# Patient Record
Sex: Female | Born: 1955 | Hispanic: No | Marital: Married | Smoking: Never smoker
Health system: Southern US, Community
[De-identification: ages and names within clinical notes are randomized; demographics above are authoritative.]

---

## 2003-04-18 ENCOUNTER — Encounter: Admission: RE | Admit: 2003-04-18 | Discharge: 2003-04-18 | Payer: Self-pay | Admitting: Family Medicine

## 2003-04-18 ENCOUNTER — Encounter: Payer: Self-pay | Admitting: Family Medicine

## 2004-01-24 ENCOUNTER — Other Ambulatory Visit: Admission: RE | Admit: 2004-01-24 | Discharge: 2004-01-24 | Payer: Self-pay | Admitting: Family Medicine

## 2008-05-18 ENCOUNTER — Other Ambulatory Visit: Admission: RE | Admit: 2008-05-18 | Discharge: 2008-05-18 | Payer: Self-pay | Admitting: Family Medicine

## 2011-05-09 ENCOUNTER — Other Ambulatory Visit: Payer: Self-pay | Admitting: Family Medicine

## 2011-05-09 ENCOUNTER — Other Ambulatory Visit (HOSPITAL_COMMUNITY)
Admission: RE | Admit: 2011-05-09 | Discharge: 2011-05-09 | Disposition: A | Discharge status: Home / Self Care | Payer: BC Managed Care – PPO | Source: Ambulatory Visit | Attending: Family Medicine | Admitting: Family Medicine

## 2011-05-09 DIAGNOSIS — Z0000 Encounter for general adult medical examination without abnormal findings: Secondary | ICD-10-CM | POA: Insufficient documentation

## 2011-05-13 ENCOUNTER — Other Ambulatory Visit: Payer: Self-pay | Admitting: Family Medicine

## 2011-05-13 DIAGNOSIS — Z1231 Encounter for screening mammogram for malignant neoplasm of breast: Secondary | ICD-10-CM

## 2011-05-15 ENCOUNTER — Ambulatory Visit
Admission: RE | Admit: 2011-05-15 | Discharge: 2011-05-15 | Disposition: A | Discharge status: Home / Self Care | Payer: BC Managed Care – PPO | Source: Ambulatory Visit | Attending: Family Medicine | Admitting: Family Medicine

## 2011-05-15 DIAGNOSIS — Z1231 Encounter for screening mammogram for malignant neoplasm of breast: Secondary | ICD-10-CM

## 2012-07-02 ENCOUNTER — Other Ambulatory Visit: Payer: Self-pay | Admitting: Dermatology

## 2014-03-02 ENCOUNTER — Other Ambulatory Visit: Payer: Self-pay | Admitting: Family Medicine

## 2014-03-02 ENCOUNTER — Other Ambulatory Visit (HOSPITAL_COMMUNITY)
Admission: RE | Admit: 2014-03-02 | Discharge: 2014-03-02 | Disposition: A | Discharge status: Home / Self Care | Payer: BC Managed Care – PPO | Source: Ambulatory Visit | Attending: Family Medicine | Admitting: Family Medicine

## 2014-03-02 DIAGNOSIS — Z0000 Encounter for general adult medical examination without abnormal findings: Secondary | ICD-10-CM | POA: Insufficient documentation

## 2014-03-07 LAB — CYTOLOGY - PAP

## 2014-03-17 ENCOUNTER — Other Ambulatory Visit: Payer: Self-pay | Admitting: Family Medicine

## 2014-03-17 ENCOUNTER — Ambulatory Visit
Admission: RE | Admit: 2014-03-17 | Discharge: 2014-03-17 | Disposition: A | Discharge status: Home / Self Care | Payer: BC Managed Care – PPO | Source: Ambulatory Visit | Attending: Family Medicine | Admitting: Family Medicine

## 2014-03-17 DIAGNOSIS — R059 Cough, unspecified: Secondary | ICD-10-CM

## 2014-03-17 DIAGNOSIS — R05 Cough: Secondary | ICD-10-CM

## 2014-04-14 ENCOUNTER — Other Ambulatory Visit: Payer: Self-pay

## 2014-04-14 DIAGNOSIS — Z1231 Encounter for screening mammogram for malignant neoplasm of breast: Secondary | ICD-10-CM

## 2014-04-21 ENCOUNTER — Ambulatory Visit
Admission: RE | Admit: 2014-04-21 | Discharge: 2014-04-21 | Disposition: A | Discharge status: Home / Self Care | Payer: BC Managed Care – PPO | Source: Ambulatory Visit

## 2014-04-21 DIAGNOSIS — Z1231 Encounter for screening mammogram for malignant neoplasm of breast: Secondary | ICD-10-CM

## 2014-09-13 ENCOUNTER — Encounter (HOSPITAL_COMMUNITY): Payer: Self-pay | Admitting: *Deleted

## 2014-09-13 ENCOUNTER — Emergency Department (HOSPITAL_COMMUNITY)
Admission: EM | Admit: 2014-09-13 | Discharge: 2014-09-13 | Disposition: A | Discharge status: Home / Self Care | Payer: BC Managed Care – PPO | Attending: Emergency Medicine | Admitting: Emergency Medicine

## 2014-09-13 ENCOUNTER — Emergency Department (HOSPITAL_COMMUNITY): Payer: BC Managed Care – PPO

## 2014-09-13 DIAGNOSIS — Z880 Allergy status to penicillin: Secondary | ICD-10-CM | POA: Insufficient documentation

## 2014-09-13 DIAGNOSIS — R002 Palpitations: Secondary | ICD-10-CM | POA: Diagnosis not present

## 2014-09-13 DIAGNOSIS — R42 Dizziness and giddiness: Secondary | ICD-10-CM | POA: Insufficient documentation

## 2014-09-13 DIAGNOSIS — R531 Weakness: Secondary | ICD-10-CM | POA: Diagnosis present

## 2014-09-13 LAB — CBC
HCT: 40.5 % (ref 36.0–46.0)
Hemoglobin: 13 g/dL (ref 12.0–15.0)
MCH: 26.3 pg (ref 26.0–34.0)
MCHC: 32.1 g/dL (ref 30.0–36.0)
MCV: 81.8 fL (ref 78.0–100.0)
Platelets: 196 10*3/uL (ref 150–400)
RBC: 4.95 MIL/uL (ref 3.87–5.11)
RDW: 13.6 % (ref 11.5–15.5)
WBC: 6.5 10*3/uL (ref 4.0–10.5)

## 2014-09-13 LAB — DIFFERENTIAL
Basophils Absolute: 0 10*3/uL (ref 0.0–0.1)
Basophils Relative: 1 % (ref 0–1)
Eosinophils Absolute: 0.1 10*3/uL (ref 0.0–0.7)
Eosinophils Relative: 1 % (ref 0–5)
Lymphocytes Relative: 32 % (ref 12–46)
Lymphs Abs: 2.1 10*3/uL (ref 0.7–4.0)
Monocytes Absolute: 0.6 10*3/uL (ref 0.1–1.0)
Monocytes Relative: 10 % (ref 3–12)
Neutro Abs: 3.7 10*3/uL (ref 1.7–7.7)
Neutrophils Relative %: 56 % (ref 43–77)

## 2014-09-13 LAB — COMPREHENSIVE METABOLIC PANEL
ALT: 14 U/L (ref 0–35)
AST: 20 U/L (ref 0–37)
Albumin: 4.1 g/dL (ref 3.5–5.2)
Alkaline Phosphatase: 59 U/L (ref 39–117)
Anion gap: 11 (ref 5–15)
BUN: 12 mg/dL (ref 6–23)
CO2: 25 mmol/L (ref 19–32)
Calcium: 9.2 mg/dL (ref 8.4–10.5)
Chloride: 104 mEq/L (ref 96–112)
Creatinine, Ser: 0.8 mg/dL (ref 0.50–1.10)
GFR calc Af Amer: >90 mL/min (ref >90)
GFR calc non Af Amer: 81 mL/min — ABNORMAL LOW (ref >90)
Glucose, Bld: 115 mg/dL — ABNORMAL HIGH (ref 70–99)
Potassium: 3.6 mmol/L (ref 3.5–5.1)
Sodium: 140 mmol/L (ref 135–145)
Total Bilirubin: 0.7 mg/dL (ref 0.3–1.2)
Total Protein: 6.9 g/dL (ref 6.0–8.3)

## 2014-09-13 LAB — APTT: aPTT: 26 seconds (ref 24–37)

## 2014-09-13 LAB — I-STAT TROPONIN, ED: Troponin i, poc: 0 ng/mL (ref 0.00–0.08)

## 2014-09-13 LAB — CBG MONITORING, ED: Glucose-Capillary: 113 mg/dL — ABNORMAL HIGH (ref 70–99)

## 2014-09-13 LAB — PROTIME-INR
INR: 1 (ref 0.00–1.49)
Prothrombin Time: 13.3 seconds (ref 11.6–15.2)

## 2014-09-13 NOTE — ED Notes (Signed)
Pt states not feeling well today and reports lightheadedness.  Pt states that her heart was racing. And then got better.  Reported she had problems with tongue movement.  No deficits.  Pt took someone else's nitro.  Spoke with Ebbie Ridgehris Lawyer and thinks not code stroke start stroke protocol.

## 2014-09-13 NOTE — ED Provider Notes (Signed)
CSN: [id number]     Arrival date & time 09/13/14  1355 History   First MD Initiated Contact with Patient 09/13/14 1604     Chief Complaint  Patient presents with  . Weakness     (Consider location/radiation/quality/duration/timing/severity/associated sxs/prior Treatment) HPI   58yF with palpitations, lightheadedness and dysarthria? Onset around 1100 today. Felt like heart was racing. Lightheaded. Mild blurred vision. Tongue felt full/thick and she felt like she couldn't control it and talk the way she wanted to. Denies any pain anywhere. No SOB. Symptoms lasted for around two hours and improved right as came to ED. She did take one of her husband's nitroglycerin during symptoms without much noticeable change. Currently no complaints. Denies any numbness, tingling or focal neuro problems aside from tongue. No diaphoresis. No nausea. Reports being under significant stress related to her job. No significant PMHx. Not on any meds. Denies drug use. Non smoker.   History reviewed. No pertinent past medical history. History reviewed. No pertinent past surgical history. No family history on file. History  Substance Use Topics  . Smoking status: Never Smoker   . Smokeless tobacco: Not on file  . Alcohol Use: No   OB History    No data available     Review of Systems  All systems reviewed and negative, other than as noted in HPI.   Allergies  Penicillins  Home Medications   Prior to Admission medications   Not on File   BP 125/67 mmHg  Pulse 75  Temp(Src) 98.2 F (36.8 C) (Oral)  Resp 15  Ht 5\' 4"  (1.626 m)  Wt 138 lb (62.596 kg)  BMI 23.68 kg/m2  SpO2 100% Physical Exam  Constitutional: She is oriented to person, place, and time. She appears well-developed and well-nourished. No distress.  Sitting in bed. NAD.  HENT:  Head: Normocephalic and atraumatic.  Mouth/Throat: Oropharynx is clear and moist.  Tongue normal in appearance  Eyes: Conjunctivae and EOM are normal.  Pupils are equal, round, and reactive to light. Right eye exhibits no discharge. Left eye exhibits no discharge.  Neck: Neck supple.  Cardiovascular: Normal rate, regular rhythm and normal heart sounds.  Exam reveals no gallop and no friction rub.   No murmur heard. Pulmonary/Chest: Effort normal and breath sounds normal. No respiratory distress.  Abdominal: Soft. She exhibits no distension. There is no tenderness.  Musculoskeletal: She exhibits no edema or tenderness.  Neurological: She is alert and oriented to person, place, and time. No cranial nerve deficit. She exhibits normal muscle tone. Coordination normal.  Speech clear. Content appropriate.Good finger to nose and heel to shin b/l. Did not ambulate.   Skin: Skin is warm and dry. She is not diaphoretic.  Psychiatric: She has a normal mood and affect. Her behavior is normal. Thought content normal.  Nursing note and vitals reviewed.   ED Course  Procedures (including critical care time) Labs Review Labs Reviewed  COMPREHENSIVE METABOLIC PANEL - Abnormal; Notable for the following:    Glucose, Bld 115 (*)    GFR calc non Af Amer 81 (*)    All other components within normal limits  CBG MONITORING, ED - Abnormal; Notable for the following:    Glucose-Capillary 113 (*)    All other components within normal limits  PROTIME-INR  APTT  CBC  DIFFERENTIAL  CBG MONITORING, ED  I-STAT TROPOININ, ED    Imaging Review Ct Head (brain) Wo Contrast  09/13/2014   CLINICAL DATA:  Lightheadedness. Acute onset of difficulties with  tongue movement. Weakness.  EXAM: CT HEAD WITHOUT CONTRAST  TECHNIQUE: Contiguous axial images were obtained from the base of the skull through the vertex without intravenous contrast.  COMPARISON:  None.  FINDINGS: No mass lesion. No midline shift. No acute hemorrhage or hematoma. No extra-axial fluid collections. No evidence of acute infarction. Brain parenchyma is normal. Prominent perivascular space at the base  of the left basal ganglia. This is a normal variant.  No osseous abnormality.  IMPRESSION: No significant abnormalities.   Electronically Signed   By: Geanie CooleyJim  Maxwell M.D.   On: 09/13/2014 16:44     EKG Interpretation   Date/Time:  Tuesday September 13 2014 14:02:22 EST Ventricular Rate:  78 PR Interval:  134 QRS Duration: 72 QT Interval:  358 QTC Calculation: 408 R Axis:   70 Text Interpretation:  Normal sinus rhythm Low voltage QRS Non-specific  ST-t changes Abnormal ECG Baseline wander No old tracing to compare  Confirmed by Monet North  MD, Billi Bright ([id number]) on 09/13/2014 4:28:10 PM      MDM   Final diagnoses:  Palpitations  Dizziness    58yF with dizziness, palpitations and difficult with tongue movement? Tachydysrhythmia? TIA? Anxiety? Not exactly clear to be based on history. Now no complaints. Non focal exam. Work-up has been unremarkable. Pt seems to think symptoms may be largely due to stress.Perhaps, but I cannot rule out TIA. CT head w/o acute findings. She is not interested in further evaluation at this time. She has medical decision making capability. Discharged. Strict return precautions discussed.     Raeford RazorStephen Jusiah Aguayo, MD 09/15/14 [phone]

## 2014-09-13 NOTE — Discharge Instructions (Signed)
Dizziness °Dizziness is a common problem. It is a feeling of unsteadiness or light-headedness. You may feel like you are about to faint. Dizziness can lead to injury if you stumble or fall. A person of any age group can suffer from dizziness, but dizziness is more common in older adults. °CAUSES  °Dizziness can be caused by many different things, including: °· Middle ear problems. °· Standing for too long. °· Infections. °· An allergic reaction. °· Aging. °· An emotional response to something, such as the sight of blood. °· Side effects of medicines. °· Tiredness. °· Problems with circulation or blood pressure. °· Excessive use of alcohol or medicines, or illegal drug use. °· Breathing too fast (hyperventilation). °· An irregular heart rhythm (arrhythmia). °· A low red blood cell count (anemia). °· Pregnancy. °· Vomiting, diarrhea, fever, or other illnesses that cause body fluid loss (dehydration). °· Diseases or conditions such as Parkinson's disease, high blood pressure (hypertension), diabetes, and thyroid problems. °· Exposure to extreme heat. °DIAGNOSIS  °Your health care provider will ask about your symptoms, perform a physical exam, and perform an electrocardiogram (ECG) to record the electrical activity of your heart. Your health care provider may also perform other heart or blood tests to determine the cause of your dizziness. These may include: °· Transthoracic echocardiogram (TTE). During echocardiography, sound waves are used to evaluate how blood flows through your heart. °· Transesophageal echocardiogram (TEE). °· Cardiac monitoring. This allows your health care provider to monitor your heart rate and rhythm in real time. °· Holter monitor. This is a portable device that records your heartbeat and can help diagnose heart arrhythmias. It allows your health care provider to track your heart activity for several days if needed. °· Stress tests by exercise or by giving medicine that makes the heart beat  faster. °TREATMENT  °Treatment of dizziness depends on the cause of your symptoms and can vary greatly. °HOME CARE INSTRUCTIONS  °· Drink enough fluids to keep your urine clear or pale yellow. This is especially important in very hot weather. In older adults, it is also important in cold weather. °· Take your medicine exactly as directed if your dizziness is caused by medicines. When taking blood pressure medicines, it is especially important to get up slowly. °¨ Rise slowly from chairs and steady yourself until you feel okay. °¨ In the morning, first sit up on the side of the bed. When you feel okay, stand slowly while holding onto something until you know your balance is fine. °· Move your legs often if you need to stand in one place for a long time. Tighten and relax your muscles in your legs while standing. °· Have someone stay with you for 1-2 days if dizziness continues to be a problem. Do this until you feel you are well enough to stay alone. Have the person call your health care provider if he or she notices changes in you that are concerning. °· Do not drive or use heavy machinery if you feel dizzy. °· Do not drink alcohol. °SEEK IMMEDIATE MEDICAL CARE IF:  °· Your dizziness or light-headedness gets worse. °· You feel nauseous or vomit. °· You have problems talking, walking, or using your arms, hands, or legs. °· You feel weak. °· You are not thinking clearly or you have trouble forming sentences. It may take a friend or family member to notice this. °· You have chest pain, abdominal pain, shortness of breath, or sweating. °· Your vision changes. °· You notice   any bleeding. °· You have side effects from medicine that seems to be getting worse rather than better. °MAKE SURE YOU:  °· Understand these instructions. °· Will watch your condition. °· Will get help right away if you are not doing well or get worse. °Document Released: 02/26/2001 Document Revised: 09/07/2013 Document Reviewed: 03/22/2011 °[employer]®  Patient Information ©2015 [employer]. This information is not intended to replace advice given to you by your health care provider. Make sure you discuss any questions you have with your health care provider. ° °Palpitations °A palpitation is the feeling that your heartbeat is irregular or is faster than normal. It may feel like your heart is fluttering or skipping a beat. Palpitations are usually not a serious problem. However, in some cases, you may need further medical evaluation. °CAUSES  °Palpitations can be caused by: °· Smoking. °· Caffeine or other stimulants, such as diet pills or energy drinks. °· Alcohol. °· Stress and anxiety. °· Strenuous physical activity. °· Fatigue. °· Certain medicines. °· Heart disease, especially if you have a history of irregular heart rhythms (arrhythmias), such as atrial fibrillation, atrial flutter, or supraventricular tachycardia. °· An improperly working pacemaker or defibrillator. °DIAGNOSIS  °To find the cause of your palpitations, your health care provider will take your medical history and perform a physical exam. Your health care provider may also have you take a test called an ambulatory electrocardiogram (ECG). An ECG records your heartbeat patterns over a 24-hour period. You may also have other tests, such as: °· Transthoracic echocardiogram (TTE). During echocardiography, sound waves are used to evaluate how blood flows through your heart. °· Transesophageal echocardiogram (TEE). °· Cardiac monitoring. This allows your health care provider to monitor your heart rate and rhythm in real time. °· Holter monitor. This is a portable device that records your heartbeat and can help diagnose heart arrhythmias. It allows your health care provider to track your heart activity for several days, if needed. °· Stress tests by exercise or by giving medicine that makes the heart beat faster. °TREATMENT  °Treatment of palpitations depends on the cause of your symptoms and can  vary greatly. Most cases of palpitations do not require any treatment other than time, relaxation, and monitoring your symptoms. Other causes, such as atrial fibrillation, atrial flutter, or supraventricular tachycardia, usually require further treatment. °HOME CARE INSTRUCTIONS  °· Avoid: °¨ Caffeinated coffee, tea, soft drinks, diet pills, and energy drinks. °¨ Chocolate. °¨ Alcohol. °· Stop smoking if you smoke. °· Reduce your stress and anxiety. Things that can help you relax include: °¨ A method of controlling things in your body, such as your heartbeats, with your mind (biofeedback). °¨ Yoga. °¨ Meditation. °¨ Physical activity such as swimming, jogging, or walking. °· Get plenty of rest and sleep. °SEEK MEDICAL CARE IF:  °· You continue to have a fast or irregular heartbeat beyond 24 hours. °· Your palpitations occur more often. °SEEK IMMEDIATE MEDICAL CARE IF: °· You have chest pain or shortness of breath. °· You have a severe headache. °· You feel dizzy or you faint. °MAKE SURE YOU: °· Understand these instructions. °· Will watch your condition. °· Will get help right away if you are not doing well or get worse. °Document Released: 08/30/2000 Document Revised: 09/07/2013 Document Reviewed: 11/01/2011 °[employer]® Patient Information ©2015 [employer]. This information is not intended to replace advice given to you by your health care provider. Make sure you discuss any questions you have with your health care provider. ° °

## 2015-11-04 IMAGING — CR DG CHEST 2V
2 series · 2 of 2 positions shown · non-contrast
Comparison: None.

CLINICAL DATA: Chronic nonproductive cough; family history of lung
malignancy

EXAM:
CHEST  2 VIEW

[view not recorded (1 of 2)]
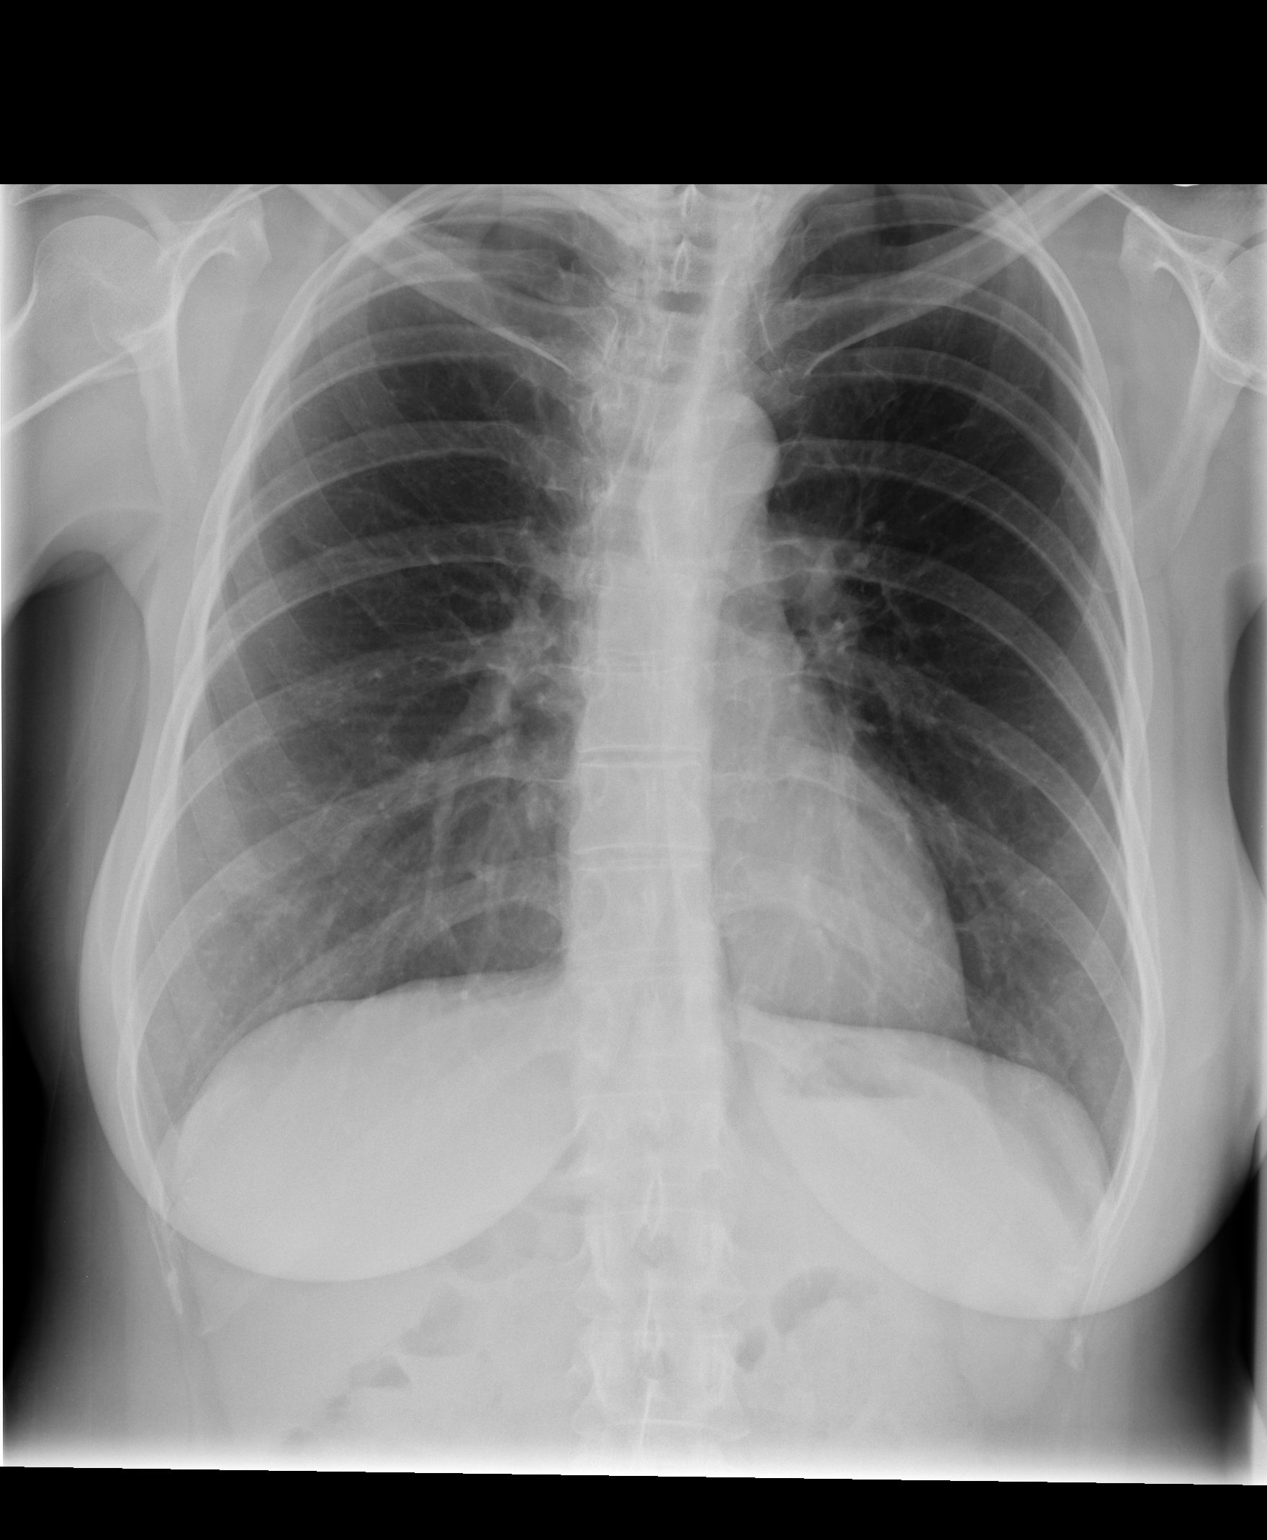

[view not recorded (2 of 2)]
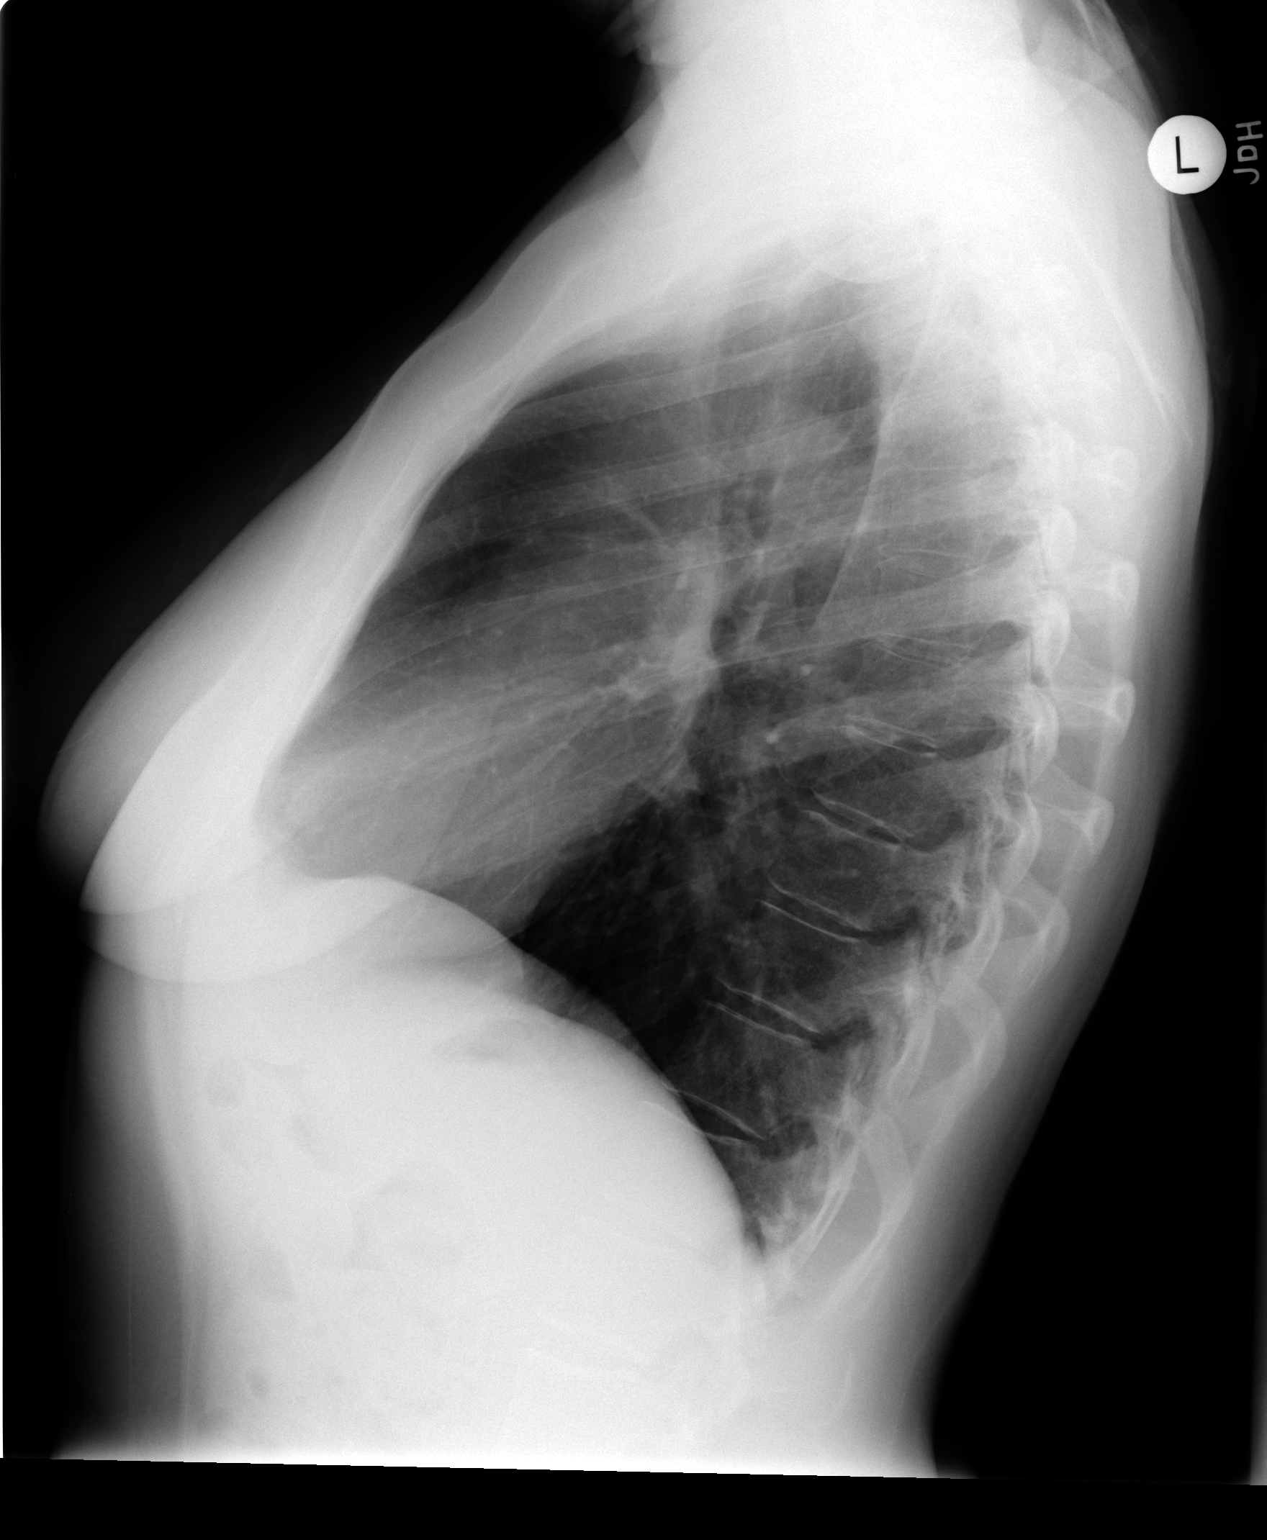

[2 of 2 positions shown; findings below may reference images not displayed]

FINDINGS: The lungs are well-expanded and clear. The heart and mediastinal
structures are normal. There is gentle dextroscoliosis of the mid
and upper thoracic spine.
IMPRESSION: There is no active cardiopulmonary disease.

## 2016-05-02 IMAGING — CT CT HEAD W/O CM
2 series · 16 of 30 positions shown, 20 images · non-contrast
Comparison: None.

CLINICAL DATA: Lightheadedness. Acute onset of difficulties with
tongue movement. Weakness.

EXAM:
CT HEAD WITHOUT CONTRAST
TECHNIQUE: Contiguous axial images were obtained from the base of the skull
through the vertex without intravenous contrast.

[Series 201: head w/o, idose (1) · axial · non-contrast · 0.41mm/px · z∈[+112,+222]mm · 13 of 26 slices shown, 17 images]
[im 2/26  brain]
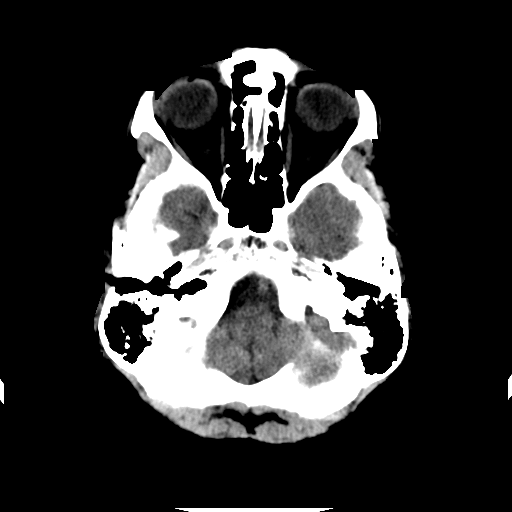
[im 2/26  bone]
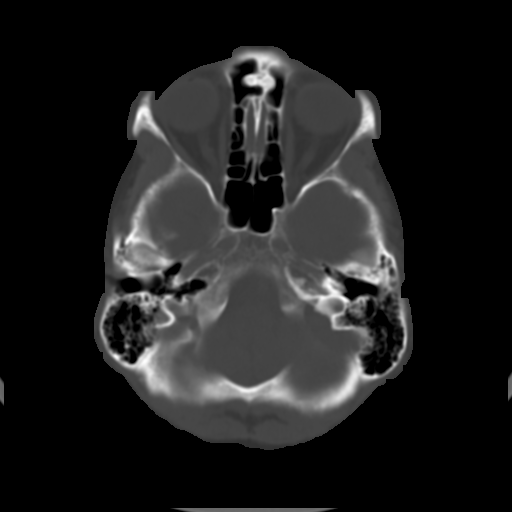
[im 4/26  brain]
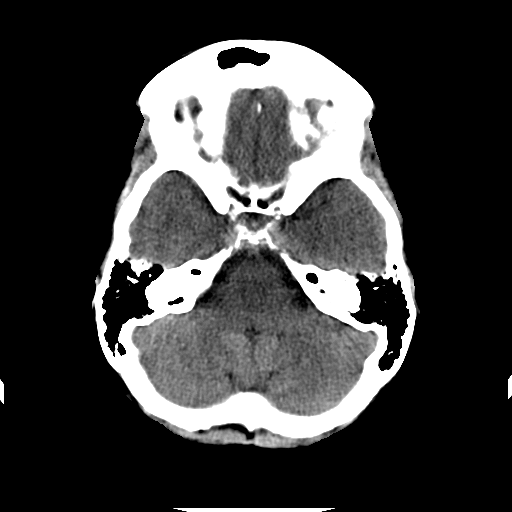
[im 6/26  brain]
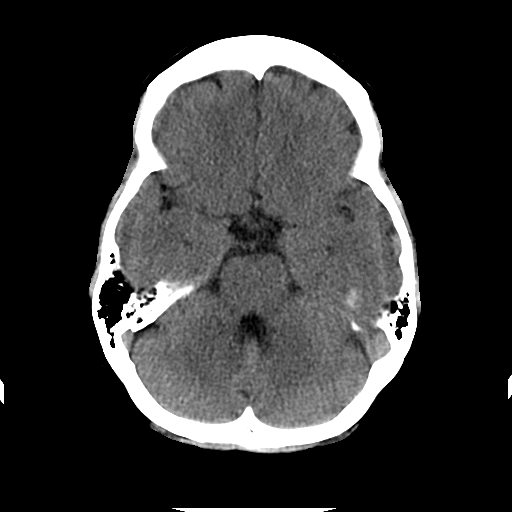
[im 8/26  brain]
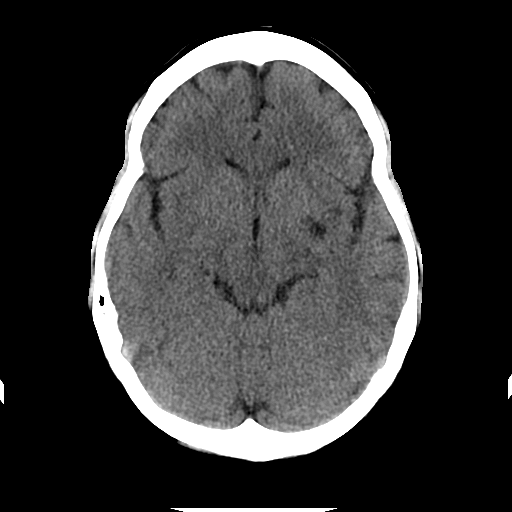
[im 9/26  brain]
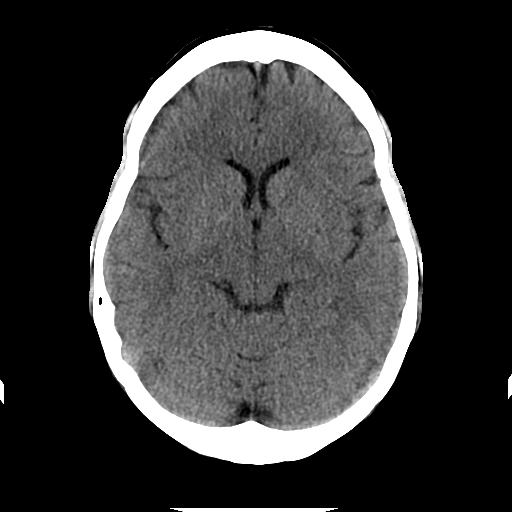
[im 9/26  bone]
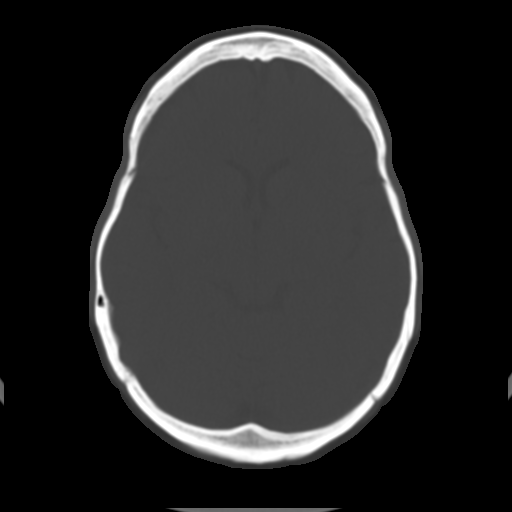
[im 11/26  brain]
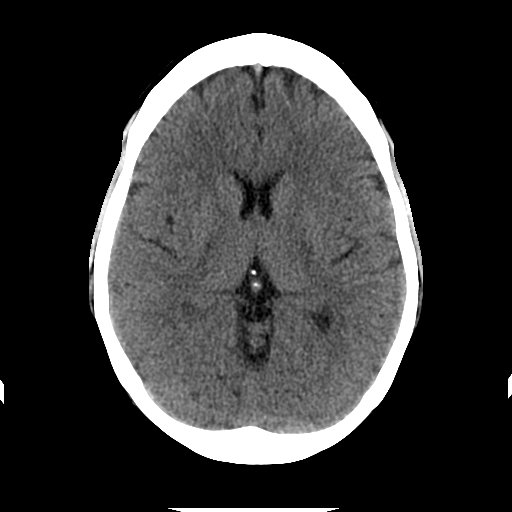
[im 13/26  brain]
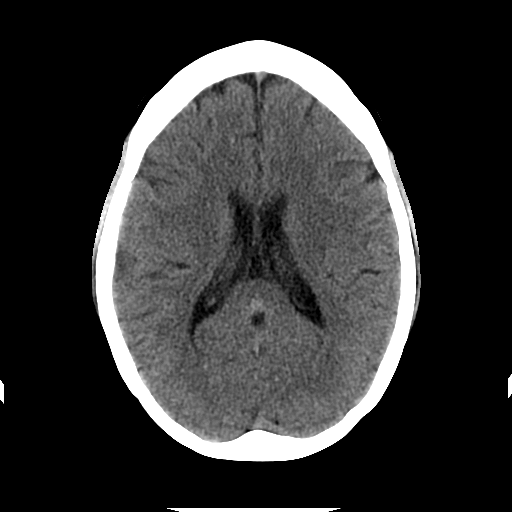
[im 15/26  brain]
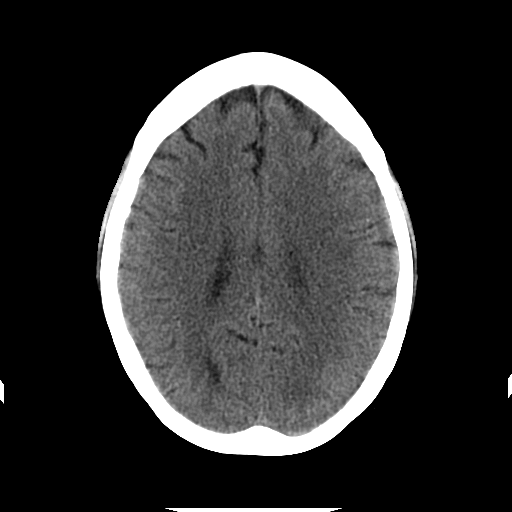
[im 17/26  brain]
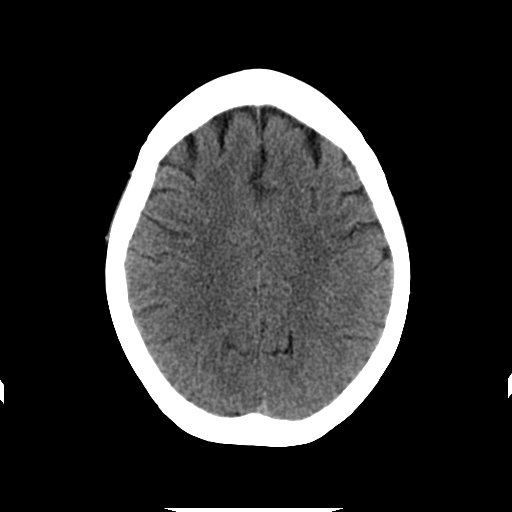
[im 17/26  bone]
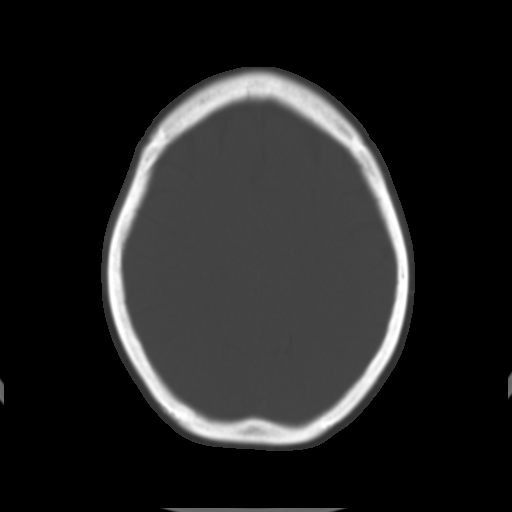
[im 18/26  brain]
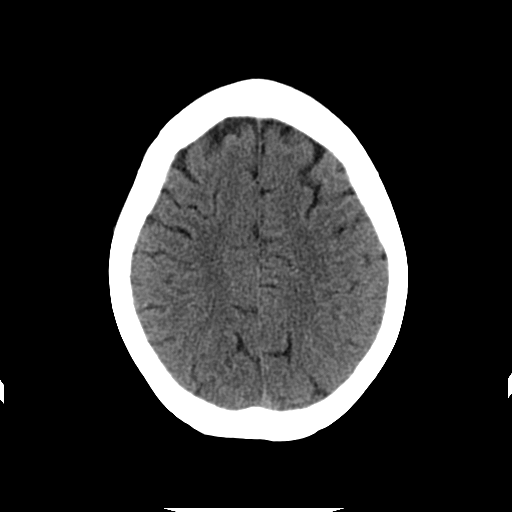
[im 20/26  brain]
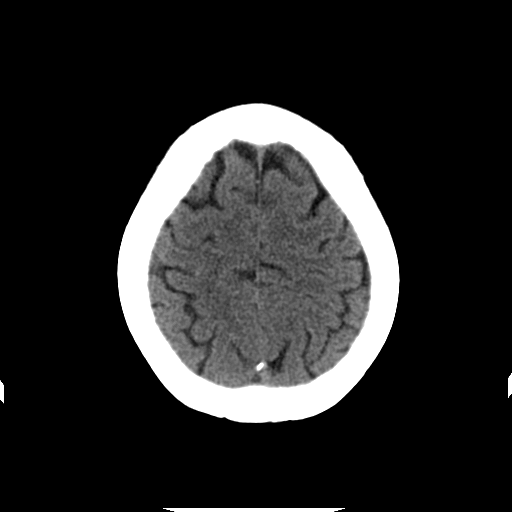
[im 22/26  brain]
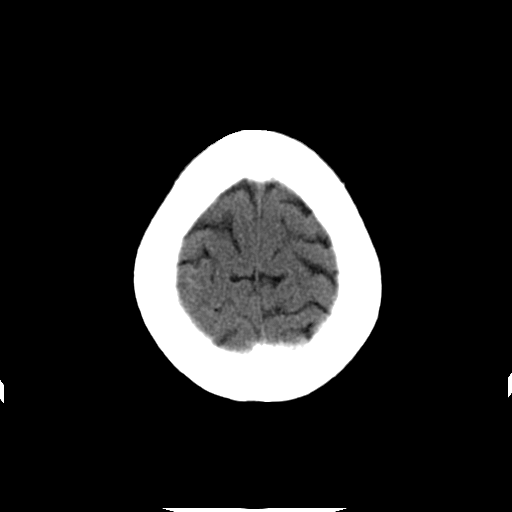
[im 24/26  brain]
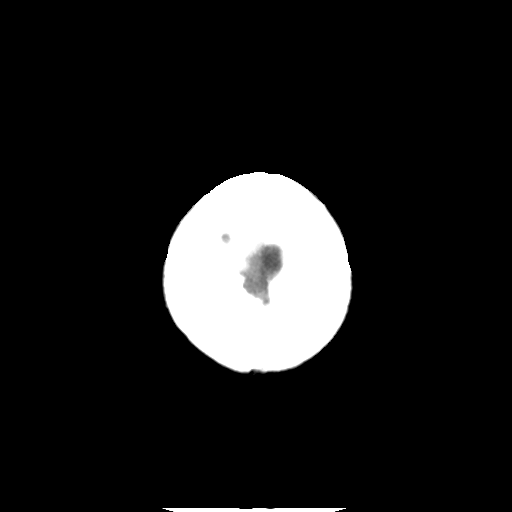
[im 24/26  bone]
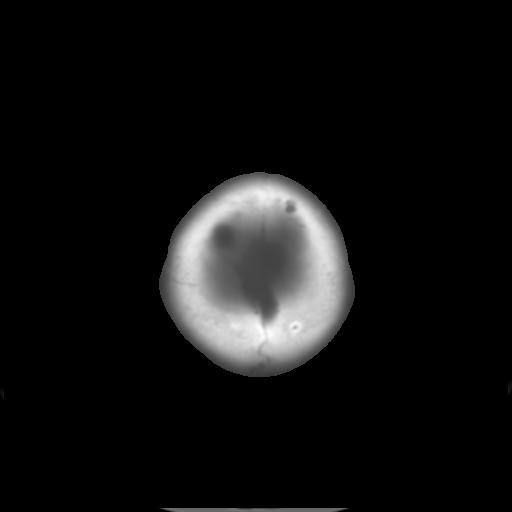

[Series 202: head w/o bone, idose (1) · axial · non-contrast · 0.41mm/px · z∈[+112,+147]mm · 3 of 26 slices shown]
[im 2/26  bone]
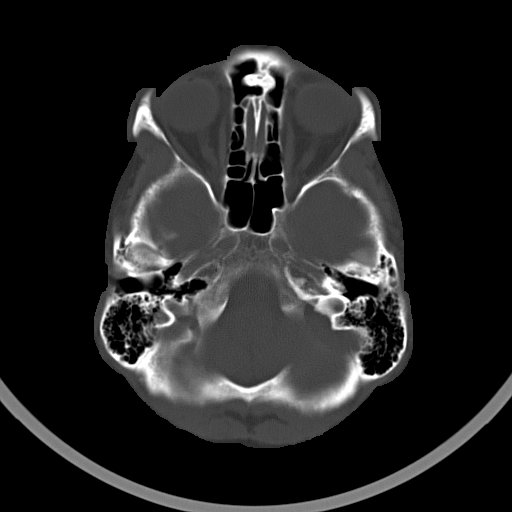
[im 6/26  bone]
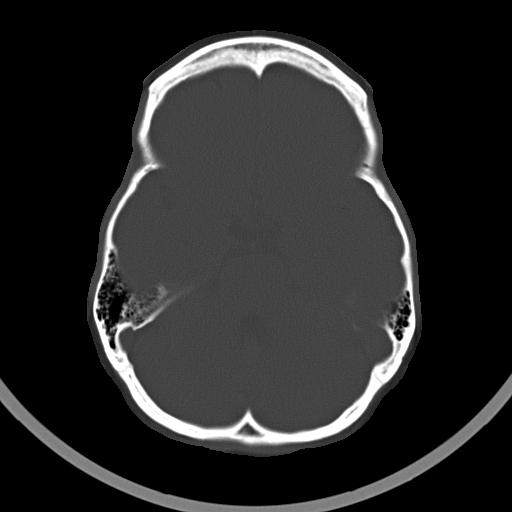
[im 9/26  bone]
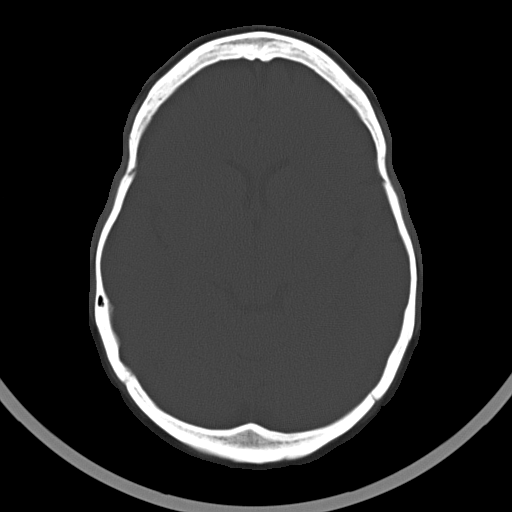

[16 of 30 positions shown; findings below may reference images not displayed]

FINDINGS: No mass lesion. No midline shift. No acute hemorrhage or hematoma.
No extra-axial fluid collections. No evidence of acute infarction.
Brain parenchyma is normal. Prominent perivascular space at the base
of the left basal ganglia. This is a normal variant.

No osseous abnormality.
IMPRESSION: No significant abnormalities.

## 2020-08-08 ENCOUNTER — Other Ambulatory Visit: Payer: Self-pay | Admitting: Family Medicine

## 2020-08-08 ENCOUNTER — Other Ambulatory Visit (HOSPITAL_COMMUNITY)
Admission: RE | Admit: 2020-08-08 | Discharge: 2020-08-08 | Disposition: A | Discharge status: Home / Self Care | Payer: Self-pay | Source: Ambulatory Visit | Attending: Cardiovascular Disease | Admitting: Cardiovascular Disease

## 2020-08-08 DIAGNOSIS — Z0000 Encounter for general adult medical examination without abnormal findings: Secondary | ICD-10-CM | POA: Insufficient documentation

## 2020-08-14 LAB — CYTOLOGY - PAP: Diagnosis: NEGATIVE

## 2023-05-12 ENCOUNTER — Other Ambulatory Visit: Payer: Self-pay

## 2023-11-25 DIAGNOSIS — H40013 Open angle with borderline findings, low risk, bilateral: Secondary | ICD-10-CM | POA: Diagnosis not present

## 2023-11-25 DIAGNOSIS — H5010 Unspecified exotropia: Secondary | ICD-10-CM | POA: Diagnosis not present

## 2023-11-25 DIAGNOSIS — H26491 Other secondary cataract, right eye: Secondary | ICD-10-CM | POA: Diagnosis not present

## 2023-11-25 DIAGNOSIS — Z961 Presence of intraocular lens: Secondary | ICD-10-CM | POA: Diagnosis not present

## 2023-12-29 DIAGNOSIS — K080 Exfoliation of teeth due to systemic causes: Secondary | ICD-10-CM | POA: Diagnosis not present

## 2024-05-11 DIAGNOSIS — E7800 Pure hypercholesterolemia, unspecified: Secondary | ICD-10-CM | POA: Diagnosis not present

## 2024-05-11 DIAGNOSIS — Z0000 Encounter for general adult medical examination without abnormal findings: Secondary | ICD-10-CM | POA: Diagnosis not present

## 2024-05-13 DIAGNOSIS — Z1211 Encounter for screening for malignant neoplasm of colon: Secondary | ICD-10-CM | POA: Diagnosis not present

## 2024-06-17 DIAGNOSIS — N870 Mild cervical dysplasia: Secondary | ICD-10-CM | POA: Diagnosis not present

## 2024-06-17 DIAGNOSIS — N9089 Other specified noninflammatory disorders of vulva and perineum: Secondary | ICD-10-CM | POA: Diagnosis not present

## 2024-06-17 DIAGNOSIS — Z01419 Encounter for gynecological examination (general) (routine) without abnormal findings: Secondary | ICD-10-CM | POA: Diagnosis not present

## 2024-06-29 DIAGNOSIS — K080 Exfoliation of teeth due to systemic causes: Secondary | ICD-10-CM | POA: Diagnosis not present

## 2024-07-20 DIAGNOSIS — N888 Other specified noninflammatory disorders of cervix uteri: Secondary | ICD-10-CM | POA: Diagnosis not present

## 2024-07-20 DIAGNOSIS — R87810 Cervical high risk human papillomavirus (HPV) DNA test positive: Secondary | ICD-10-CM | POA: Diagnosis not present

## 2024-07-20 DIAGNOSIS — N9089 Other specified noninflammatory disorders of vulva and perineum: Secondary | ICD-10-CM | POA: Diagnosis not present

## 2024-07-20 DIAGNOSIS — L900 Lichen sclerosus et atrophicus: Secondary | ICD-10-CM | POA: Diagnosis not present
